# Patient Record
Sex: Male | Born: 1950 | Race: White | Hispanic: No | Marital: Married
Health system: Southern US, Community
[De-identification: ages and names within clinical notes are randomized; demographics above are authoritative.]

---

## 2014-06-14 ENCOUNTER — Emergency Department (INDEPENDENT_AMBULATORY_CARE_PROVIDER_SITE_OTHER): Payer: Commercial Managed Care - PPO

## 2014-06-14 ENCOUNTER — Emergency Department
Admission: EM | Admit: 2014-06-14 | Discharge: 2014-06-14 | Disposition: A | Discharge status: Home / Self Care | Payer: Commercial Managed Care - PPO | Source: Home / Self Care | Attending: Family Medicine | Admitting: Family Medicine

## 2014-06-14 DIAGNOSIS — M549 Dorsalgia, unspecified: Secondary | ICD-10-CM

## 2014-06-14 DIAGNOSIS — S233XXA Sprain of ligaments of thoracic spine, initial encounter: Secondary | ICD-10-CM

## 2014-06-14 DIAGNOSIS — S39012A Strain of muscle, fascia and tendon of lower back, initial encounter: Secondary | ICD-10-CM

## 2014-06-14 DIAGNOSIS — Y92009 Unspecified place in unspecified non-institutional (private) residence as the place of occurrence of the external cause: Secondary | ICD-10-CM

## 2014-06-14 DIAGNOSIS — W19XXXA Unspecified fall, initial encounter: Secondary | ICD-10-CM

## 2014-06-14 DIAGNOSIS — S29012A Strain of muscle and tendon of back wall of thorax, initial encounter: Secondary | ICD-10-CM

## 2014-06-14 MED ORDER — HYDROCODONE-ACETAMINOPHEN 5-325 MG PO TABS: 1.0000 | ORAL_TABLET | Freq: Four times a day (QID) | ORAL | Status: AC | PRN

## 2014-06-14 MED ORDER — CYCLOBENZAPRINE HCL 10 MG PO TABS: 10.0000 mg | ORAL_TABLET | Freq: Three times a day (TID) | ORAL | Status: AC

## 2014-06-14 MED ORDER — KETOROLAC TROMETHAMINE 60 MG/2ML IJ SOLN
60.0000 mg | Freq: Once | INTRAMUSCULAR | Status: AC
  Administered 2014-06-14: 60 mg via INTRAMUSCULAR

## 2014-06-14 NOTE — Discharge Instructions (Signed)
Apply ice pack for 20 to 30 minutes, 3 to 4 times daily  Continue until pain decreases.  Begin stretching exercises in about five days.   Back Exercises Back exercises help treat and prevent back injuries. The goal of back exercises is to increase the strength of your abdominal and back muscles and the flexibility of your back. These exercises should be started when you no longer have back pain. Back exercises include:  Pelvic Tilt. Lie on your back with your knees bent. Tilt your pelvis until the lower part of your back is against the floor. Hold this position 5 to 10 sec and repeat 5 to 10 times.  Knee to Chest. Pull first 1 knee up against your chest and hold for 20 to 30 seconds, repeat this with the other knee, and then both knees. This may be done with the other leg straight or bent, whichever feels better.  Sit-Ups or Curl-Ups. Bend your knees 90 degrees. Start with tilting your pelvis, and do a partial, slow sit-up, lifting your trunk only 30 to 45 degrees off the floor. Take at least 2 to 3 seconds for each sit-up. Do not do sit-ups with your knees out straight. If partial sit-ups are difficult, simply do the above but with only tightening your abdominal muscles and holding it as directed.  Hip-Lift. Lie on your back with your knees flexed 90 degrees. Push down with your feet and shoulders as you raise your hips a couple inches off the floor; hold for 10 seconds, repeat 5 to 10 times.  Back arches. Lie on your stomach, propping yourself up on bent elbows. Slowly press on your hands, causing an arch in your low back. Repeat 3 to 5 times. Any initial stiffness and discomfort should lessen with repetition over time.  Shoulder-Lifts. Lie face down with arms beside your body. Keep hips and torso pressed to floor as you slowly lift your head and shoulders off the floor. Do not overdo your exercises, especially in the beginning. Exercises may cause you some mild back discomfort which lasts for a  few minutes; however, if the pain is more severe, or lasts for more than 15 minutes, do not continue exercises until you see your caregiver. Improvement with exercise therapy for back problems is slow.  See your caregivers for assistance with developing a proper back exercise program. Document Released: 07/20/2004 Document Revised: 09/04/2011 Document Reviewed: 04/13/2011 [facility] Patient Information 2015 [employer]. This information is not intended to replace advice given to you by your health care provider. Make sure you discuss any questions you have with your health care provider.

## 2014-06-14 NOTE — ED Provider Notes (Signed)
CSN: [id number]     Arrival date & time 06/14/14  1702 History   First MD Initiated Contact with Patient 06/14/14 1716     Chief Complaint  Patient presents with  . Back Pain      HPI Comments: Yesterday patient fell backwards off a 2 foot high stool, landing on his back and buttocks.  He has had persistent mid and lower back pain with movement, but no radicular symptoms.  No bowel or bladder dysfunction.  No saddle numbness.  He has pain with inspiration, but no shortness of breath.  Patient is a 63 y.o. male presenting with back pain. The history is provided by the patient.  Back Pain Location:  Thoracic spine and lumbar spine Quality:  Aching Radiates to:  Does not radiate Pain severity:  Moderate Pain is:  Same all the time Onset quality:  Sudden Duration:  1 day Timing:  Constant Progression:  Unchanged Chronicity:  New Context: falling   Relieved by:  Nothing Worsened by:  Movement, deep breathing and twisting Ineffective treatments:  NSAIDs and heating pad Associated symptoms: no abdominal pain, no abdominal swelling, no bladder incontinence, no bowel incontinence, no chest pain, no fever, no leg pain, no numbness, no paresthesias, no pelvic pain, no perianal numbness, no tingling and no weakness   Risk factors: obesity     No past medical history on file. No past surgical history on file. No family history on file. History  Substance Use Topics  . Smoking status: Not on file  . Smokeless tobacco: Not on file  . Alcohol Use: Not on file    Review of Systems  Constitutional: Negative for fever.  Cardiovascular: Negative for chest pain.  Gastrointestinal: Negative for abdominal pain and bowel incontinence.  Genitourinary: Negative for bladder incontinence and pelvic pain.  Musculoskeletal: Positive for back pain.  Neurological: Negative for tingling, weakness, numbness and paresthesias.  All other systems reviewed and are negative.   Allergies  Review of  patient's allergies indicates no known allergies.  Home Medications   Prior to Admission medications   Medication Sig Start Date End Date Taking? Authorizing Provider  allopurinol (ZYLOPRIM) 300 MG tablet Take 300 mg by mouth daily.   Yes Historical Provider, MD  Armodafinil 250 MG tablet Take 250 mg by mouth daily.   Yes Historical Provider, MD  Calcium Carbonate Antacid (TUMS E-X SUGAR FREE PO) Take by mouth. 750 mg 1200 mg Calcium   Yes Historical Provider, MD  Cholecalciferol 5000 UNITS capsule Take 5,000 Units by mouth daily.   Yes Historical Provider, MD  dabigatran (PRADAXA) 75 MG CAPS capsule Take 75 mg by mouth 2 (two) times daily.   Yes Historical Provider, MD  dronedarone (MULTAQ) 400 MG tablet Take 400 mg by mouth 2 (two) times daily with a meal.   Yes Historical Provider, MD  ferrous sulfate 325 (65 FE) MG tablet Take 325 mg by mouth daily with breakfast.   Yes Historical Provider, MD  hydrochlorothiazide (HYDRODIURIL) 25 MG tablet Take 25 mg by mouth daily.   Yes Historical Provider, MD  lisinopril (PRINIVIL,ZESTRIL) 20 MG tablet Take 20 mg by mouth daily.   Yes Historical Provider, MD  metoprolol succinate (TOPROL-XL) 50 MG 24 hr tablet Take 50 mg by mouth daily. Take with or immediately following a meal.   Yes Historical Provider, MD  Multiple Vitamins-Minerals (CENTRUM SILVER PO) Take by mouth daily.   Yes Historical Provider, MD  pyridOXINE (VITAMIN B-6) 100 MG tablet Take 100 mg by  mouth daily.   Yes Historical Provider, MD  rOPINIRole (REQUIP) 0.5 MG tablet Take 0.5 mg by mouth 3 (three) times daily.   Yes Historical Provider, MD  simvastatin (ZOCOR) 20 MG tablet Take 20 mg by mouth daily.   Yes Historical Provider, MD  vitamin B-12 (CYANOCOBALAMIN) 1000 MCG tablet Take 1,000 mcg by mouth daily.   Yes Historical Provider, MD  vitamin C (ASCORBIC ACID) 500 MG tablet Take 500 mg by mouth daily.   Yes Historical Provider, MD  cyclobenzaprine (FLEXERIL) 10 MG tablet Take 1  tablet (10 mg total) by mouth 3 (three) times daily. 06/14/14   Lattie Haw, MD  HYDROcodone-acetaminophen (NORCO/VICODIN) 5-325 MG per tablet Take 1 tablet by mouth every 6 (six) hours as needed. 06/14/14   Lattie Haw, MD   BP 154/80 mmHg  Pulse 56  Temp(Src) 98 F (36.7 C) (Oral)  Wt 280 lb (127.007 kg)  SpO2 100% Physical Exam  Constitutional: He is oriented to person, place, and time. He appears well-developed and well-nourished. No distress.  Patient is obese.  HENT:  Head: Atraumatic.  Eyes: Pupils are equal, round, and reactive to light.  Neck: Normal range of motion.  Cardiovascular: Normal heart sounds.   Pulmonary/Chest: Breath sounds normal.  Abdominal: There is no tenderness.  Musculoskeletal:       Thoracic back: He exhibits tenderness and bony tenderness. He exhibits no swelling and no deformity.       Lumbar back: He exhibits decreased range of motion, tenderness and bony tenderness. He exhibits no swelling and no deformity.       Back:  Back:  Decreased range of motion.  Can heel/toe walk and squat without difficulty.  Tenderness in the midline and bilatera paraspinous muscles from mid-thoracic to Sacral area.  Straight leg raising test is negative.  Sitting knee extension test is negative.  Strength and sensation in the lower extremities is normal.  Patellar and achilles reflexes are normal.  There is tenderness over bilateral posterior ribs as noted on diagram.    Neurological: He is alert and oriented to person, place, and time.  Skin: Skin is warm and dry. No rash noted.  Nursing note and vitals reviewed.   ED Course  Procedures  none    Imaging Review Dg Ribs Bilateral  06/14/2014   CLINICAL DATA:  Fall, back pain  EXAM: BILATERAL RIBS - 3+ VIEW  COMPARISON:  None.  FINDINGS: Multiple old left rib fracture deformities.  No displaced rib fracture is seen bilaterally.  Visualized lungs are clear.  IMPRESSION: Multiple old left rib fracture  deformities.  No displaced rib fracture is seen bilaterally.   Electronically Signed   By: Charline Bills M.D.   On: 06/14/2014 19:01   Dg Thoracic Spine 2 View  06/14/2014   CLINICAL DATA:  Back pain after two foot fall from a stool 06/13/2014.  EXAM: THORACIC SPINE - 2 VIEW  COMPARISON:  None.  FINDINGS: No fracture or malalignment is identified. Multilevel anterior endplate spurring is noted.  IMPRESSION: No acute abnormality.   Electronically Signed   By: Drusilla Kanner M.D.   On: 06/14/2014 19:00   Dg Lumbar Spine Complete  06/14/2014   CLINICAL DATA:  Back pain after a two foot fall from a stool 06/13/2014. Initial encounter.  EXAM: LUMBAR SPINE - COMPLETE 4+ VIEW  COMPARISON:  None.  FINDINGS: There is no fracture or malalignment. Lower lumbar facet arthropathy and mild multilevel anterior endplate spurring are noted.  IMPRESSION:  No acute abnormality.   Electronically Signed   By: Drusilla Kannerhomas  Dalessio M.D.   On: 06/14/2014 19:01     MDM   1. Lumbar strain, initial encounter   2. Fall at home, initial encounter   3. Back pain   4. Thoracic sprain and strain, initial encounter    Toradol 60mg  IM. Begin Flexeril.  Lortab as needed for pain. Apply ice pack for 20 to 30 minutes, 3 to 4 times daily  Continue until pain decreases.  Begin stretching exercises in about five days. Followup with Dr. Rodney Langtonhomas [facility]kkekand[facility] ([facility] if not improving about two weeks.     Lattie HawStephen A Dayle Mcnerney, MD 06/14/14 [phone]

## 2014-06-14 NOTE — ED Notes (Signed)
Patient fell yesterday. Patient was on step stool and fell off, landed on back

## 2014-12-25 DEATH — deceased
